# Patient Record
Sex: Female | Born: 1960 | Race: White | Hispanic: Yes | Marital: Single
Health system: Southern US, Community
[De-identification: ages and names within clinical notes are randomized; demographics above are authoritative.]

---

## 2000-12-01 ENCOUNTER — Encounter: Payer: Self-pay | Admitting: Emergency Medicine

## 2000-12-01 ENCOUNTER — Emergency Department (HOSPITAL_COMMUNITY): Admission: EM | Admit: 2000-12-01 | Discharge: 2000-12-01 | Payer: Self-pay | Admitting: Emergency Medicine

## 2004-01-28 ENCOUNTER — Ambulatory Visit (HOSPITAL_COMMUNITY): Admission: RE | Admit: 2004-01-28 | Discharge: 2004-01-28 | Payer: Self-pay | Admitting: *Deleted

## 2018-04-06 ENCOUNTER — Encounter (HOSPITAL_COMMUNITY): Payer: Self-pay | Admitting: Emergency Medicine

## 2018-04-06 ENCOUNTER — Emergency Department (HOSPITAL_COMMUNITY): Payer: Managed Care, Other (non HMO)

## 2018-04-06 ENCOUNTER — Emergency Department (HOSPITAL_COMMUNITY)
Admission: EM | Admit: 2018-04-06 | Discharge: 2018-04-06 | Disposition: A | Discharge status: Home / Self Care | Payer: Managed Care, Other (non HMO) | Attending: Emergency Medicine | Admitting: Emergency Medicine

## 2018-04-06 DIAGNOSIS — R202 Paresthesia of skin: Secondary | ICD-10-CM | POA: Insufficient documentation

## 2018-04-06 DIAGNOSIS — R51 Headache: Secondary | ICD-10-CM | POA: Insufficient documentation

## 2018-04-06 DIAGNOSIS — R519 Headache, unspecified: Secondary | ICD-10-CM

## 2018-04-06 LAB — I-STAT TROPONIN, ED: Troponin i, poc: 0 ng/mL (ref 0.00–0.08)

## 2018-04-06 LAB — BASIC METABOLIC PANEL
Anion gap: 9 (ref 5–15)
BUN: 9 mg/dL (ref 6–20)
CHLORIDE: 105 mmol/L (ref 98–111)
CO2: 26 mmol/L (ref 22–32)
CREATININE: 0.62 mg/dL (ref 0.44–1.00)
Calcium: 9 mg/dL (ref 8.9–10.3)
GFR calc Af Amer: >60 mL/min (ref >60)
GFR calc non Af Amer: >60 mL/min (ref >60)
GLUCOSE: 128 mg/dL — AB (ref 70–99)
Potassium: 3.6 mmol/L (ref 3.5–5.1)
SODIUM: 140 mmol/L (ref 135–145)

## 2018-04-06 LAB — CBC
HCT: 37.9 % (ref 36.0–46.0)
Hemoglobin: 12.2 g/dL (ref 12.0–15.0)
MCH: 28.4 pg (ref 26.0–34.0)
MCHC: 32.2 g/dL (ref 30.0–36.0)
MCV: 88.3 fL (ref 78.0–100.0)
Platelets: 232 10*3/uL (ref 150–400)
RBC: 4.29 MIL/uL (ref 3.87–5.11)
RDW: 13.5 % (ref 11.5–15.5)
WBC: 5.5 10*3/uL (ref 4.0–10.5)

## 2018-04-06 MED ORDER — SODIUM CHLORIDE 0.9 % IV BOLUS
1000.0000 mL | Freq: Once | INTRAVENOUS | Status: AC
  Administered 2018-04-06: 1000 mL via INTRAVENOUS

## 2018-04-06 MED ORDER — PROCHLORPERAZINE EDISYLATE 10 MG/2ML IJ SOLN
10.0000 mg | Freq: Once | INTRAMUSCULAR | Status: AC
Start: 2018-04-06 — End: 2018-04-06
  Administered 2018-04-06: 10 mg via INTRAVENOUS
  Filled 2018-04-06: qty 2

## 2018-04-06 MED ORDER — DIPHENHYDRAMINE HCL 50 MG/ML IJ SOLN
25.0000 mg | Freq: Once | INTRAMUSCULAR | Status: AC
Start: 2018-04-06 — End: 2018-04-06
  Administered 2018-04-06: 25 mg via INTRAVENOUS
  Filled 2018-04-06: qty 1

## 2018-04-06 MED ORDER — KETOROLAC TROMETHAMINE 15 MG/ML IJ SOLN
15.0000 mg | Freq: Once | INTRAMUSCULAR | Status: AC
  Administered 2018-04-06: 15 mg via INTRAVENOUS
  Filled 2018-04-06: qty 1

## 2018-04-06 NOTE — Discharge Instructions (Addendum)
You were evaluated in the Emergency Department and after careful evaluation, we did not find any emergent condition requiring admission or further testing in the hospital.  Your symptoms today seem to be due to a migraine.  Please follow-up with your primary care provider to discuss preventative medications.  Please return to the Emergency Department if you experience any worsening of your condition.  We encourage you to follow up with a primary care provider.  Thank you for allowing us to be a part of your care.

## 2018-04-06 NOTE — ED Provider Notes (Signed)
[facility] Provider Note MRN:  [id number]  Arrival date & time: 04/07/18     Chief Complaint   Headache History of Present Illness   Shirley Fosteratalia Koons is a 57 y.o. year-old female with a history of prediabetes presenting to the ED with chief complaint of headache.  Patient explains that she, in the past, has never experienced headaches.  For the past 1 to 2 weeks, has been experiencing frequent frontal headaches, with pain radiating to the right side of the neck.  Pain is moderate in severity, became worse last night and this morning.  Pain associated with bilateral facial paresthesias, described as a cold sensation, that also is present in the left arm.  Patient denies vision change, no chest pain, no shortness of breath, no abdominal pain, no dysuria.  No numbness or weakness in the arms or legs.  Review of Systems  A complete 10 system review of systems was obtained and all systems are negative except as noted in the HPI and PMH.  Patient's Health History   Past Medical/Surgical History:  Pre-diabetic  Family History:  Non-contributory  Social History:  Non-smoker   Social History   Socioeconomic History  . Marital status: Single    Spouse name: Not on file  . Number of children: Not on file  . Years of education: Not on file  . Highest education level: Not on file  Occupational History  . Not on file  Social Needs  . Financial resource strain: Not on file  . Food insecurity:    Worry: Not on file    Inability: Not on file  . Transportation needs:    Medical: Not on file    Non-medical: Not on file  Tobacco Use  . Smoking status: Not on file  Substance and Sexual Activity  . Alcohol use: Not on file  . Drug use: Not on file  . Sexual activity: Not on file  Lifestyle  . Physical activity:    Days per week: Not on file    Minutes per session: Not on file  . Stress: Not on file  Relationships  . Social connections:   Talks on phone: Not on file    Gets together: Not on file    Attends religious service: Not on file    Active member of club or organization: Not on file    Attends meetings of clubs or organizations: Not on file    Relationship status: Not on file  . Intimate partner violence:    Fear of current or ex partner: Not on file    Emotionally abused: Not on file    Physically abused: Not on file    Forced sexual activity: Not on file  Other Topics Concern  . Not on file  Social History Narrative  . Not on file     Physical Exam  Vital Signs and Nursing Notes reviewed Vitals:   04/06/18 1645 04/06/18 1700  BP: 96/61 (!) 96/59  Pulse: 61 (!) 57  Resp: 18 12  Temp:    SpO2: 100% 100%    CONSTITUTIONAL: Well-appearing, NAD NEURO:  Alert and oriented x 3, no focal deficits EYES:  eyes equal and reactive ENT/NECK:  no LAD, no JVD CARDIO: Regular rate, well-perfused, normal S1 and S2 PULM:  CTAB no wheezing or rhonchi GI/GU:  normal bowel sounds, non-distended, non-tender MSK/SPINE:  No gross deformities, no edema SKIN:  no rash, atraumatic PSYCH:  Appropriate speech and behavior  Diagnostic and  Interventional Summary    EKG Interpretation  Date/Time:  Sunday April 06 2018 12:55:04 EDT Ventricular Rate:  66 PR Interval:  128 QRS Duration: 84 QT Interval:  408 QTC Calculation: 427 R Axis:   89 Text Interpretation:  Normal sinus rhythm Normal ECG Confirmed by Kennis Carina [phone]) on 04/06/2018 2:44:03 PM      Labs Reviewed  BASIC METABOLIC PANEL - Abnormal; Notable for the following components:      Result Value   Glucose, Bld 128 (*)    All other components within normal limits  CBC  I-STAT TROPONIN, ED    CT Head Wo Contrast  Final Result    DG Chest 2 View  Final Result    DG Cervical Spine Complete  Final Result      Medications  prochlorperazine (COMPAZINE) injection 10 mg (10 mg Intravenous Given 04/06/18 1628)  ketorolac (TORADOL) 15 MG/ML injection  15 mg (15 mg Intravenous Given 04/06/18 1628)  diphenhydrAMINE (BENADRYL) injection 25 mg (25 mg Intravenous Given 04/06/18 1626)  sodium chloride 0.9 % bolus 1,000 mL (0 mLs Intravenous Stopped 04/06/18 1720)     Procedures Critical Care  ED Course and Medical Decision Making  I have reviewed the triage vital signs and the nursing notes.  Pertinent labs & imaging results that were available during my care of the patient were reviewed by me and considered in my medical decision making (see below for details). Clinical Course as of Apr 07 101  Sun Apr 06, 2018  [id number] 57 year old female otherwise healthy presenting with new headaches for the past 1 to 2 weeks.  Becoming more severe last night this morning.  Associated with bilateral facial paresthesias with paresthesias to the left arm.  No pain with range of motion of the neck, Spurling test negative.  Vital signs stable, well-appearing, no focal neurological deficits.  Favoring complex migraine, but given newness of these headaches, will CT head today.  Does not appear to be related to cervical radiculopathy.  Seems inconsistent with cardiac etiology.   [MB]  1709 Work-up unremarkable, CT negative.  Patient's headache and paresthesias are resolved after migraine cocktail.  Patient's neurological symptoms were inconsistent with TIA, favoring complex migraine.  Advised to follow-up with PCP to discuss preventative medications.After the discussed management above, the patient was determined to be safe for discharge.  The patient was in agreement with this plan and all questions regarding their care were answered.  ED return precautions were discussed and the patient will return to the ED with any significant worsening of condition.   [MB]    Clinical Course User Index [MB] Pilar Plate Elmer Sow, MD     Elmer Sow. Pilar Plate, MD [facility] [email or web address]  Final Clinical Impressions(s) / ED Diagnoses      ICD-10-CM   1. Nonintractable headache, unspecified chronicity pattern, unspecified headache type R51   2. Paresthesias R20.2     ED Discharge Orders    None         Sabas Sous, MD 04/07/18 [phone]

## 2018-04-06 NOTE — ED Notes (Signed)
Pt verbalizes understanding of d/c instructions. Pt ambulatory at d/c with all belongings. Pt's sister to come pick her up.

## 2018-04-06 NOTE — ED Triage Notes (Signed)
Patient complains of left arm pain radiating into neck and head for two months, also reports intermittent nausea with pain. States sometimes left arm is also numb. Patient denies shortness of breath. Patient alert, oriented, and in no apparent distress at this time.

## 2018-04-06 NOTE — ED Notes (Signed)
Patient transported to CT 

## 2019-08-18 IMAGING — CR DG CERVICAL SPINE COMPLETE 4+V
5 series · 5 of 5 positions shown · non-contrast
Comparison: None.

CLINICAL DATA: Left arm pain radiating to the neck for 2 months.

EXAM:
CERVICAL SPINE - COMPLETE 4+ VIEW

[c-spine lat]
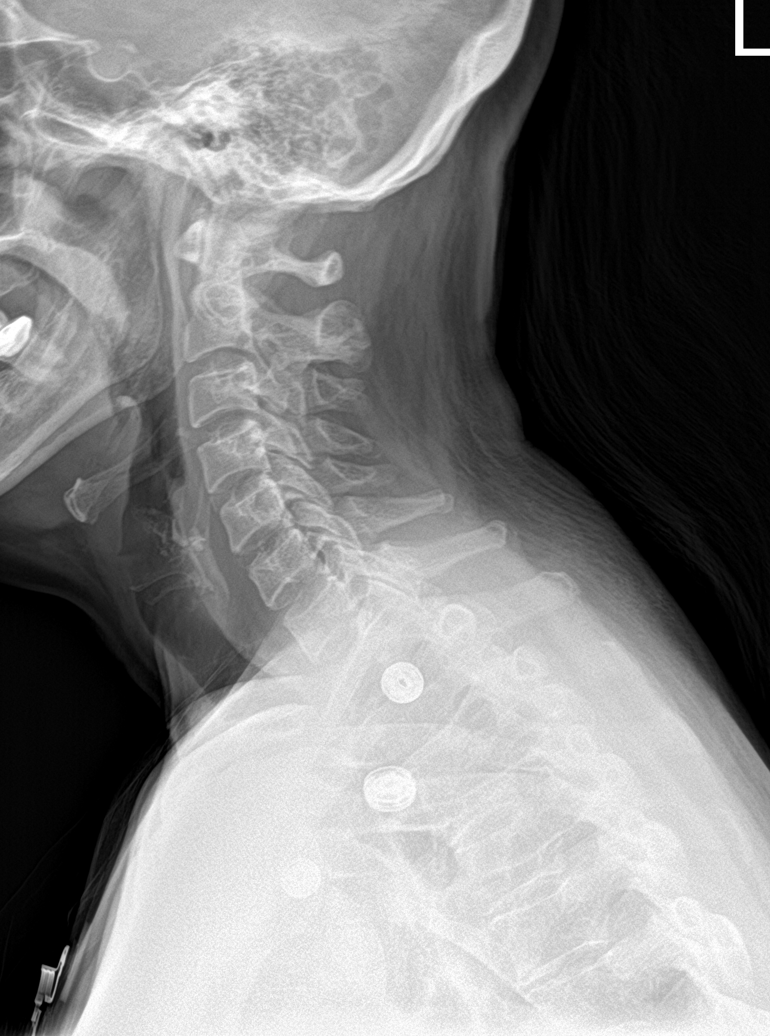

[c-spine obl (1 of 2)]
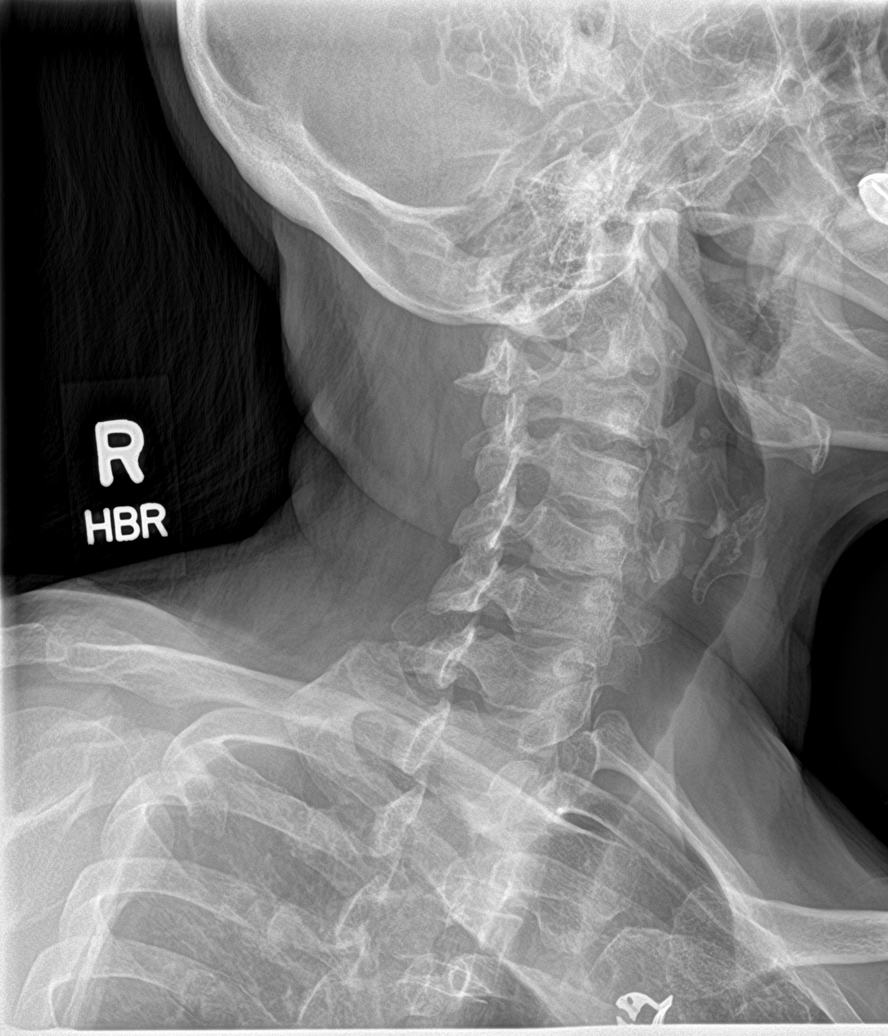

[c-spine obl (2 of 2)]
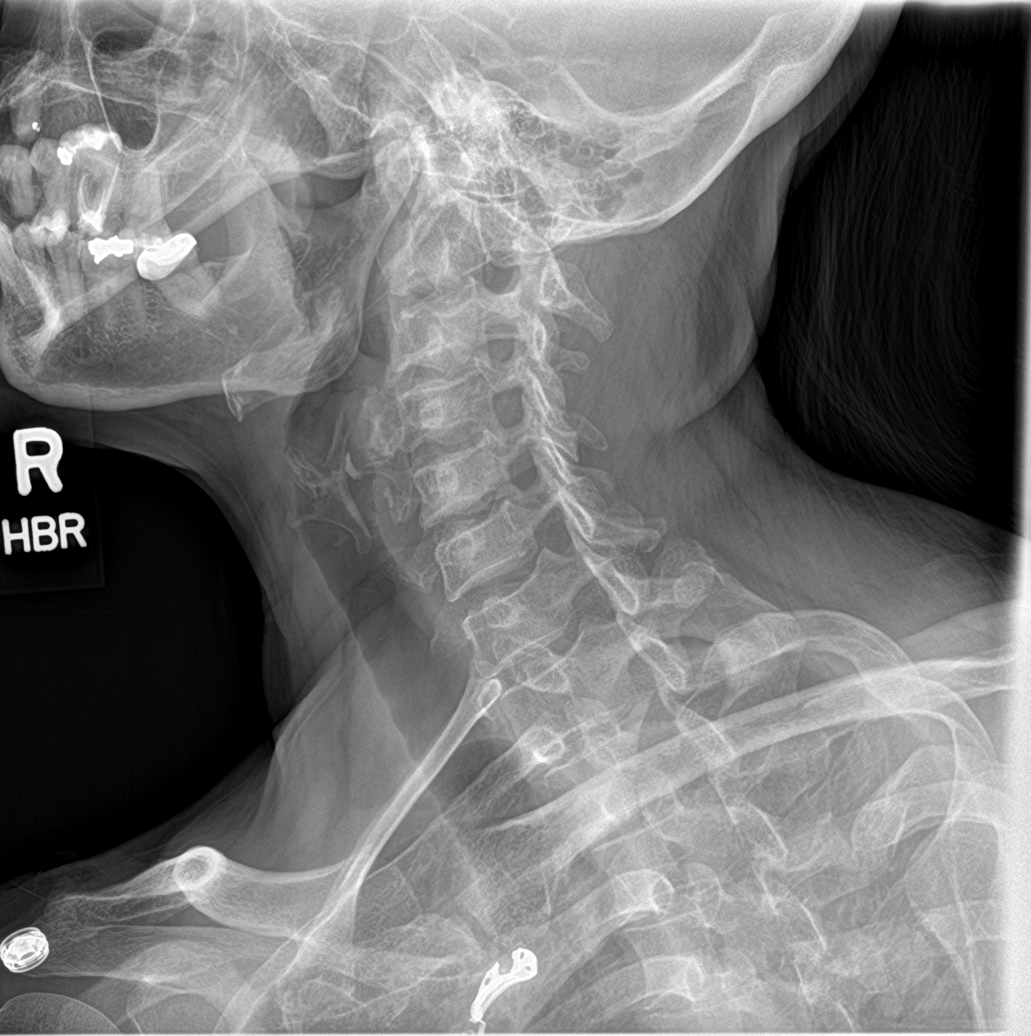

[c-spine ap]
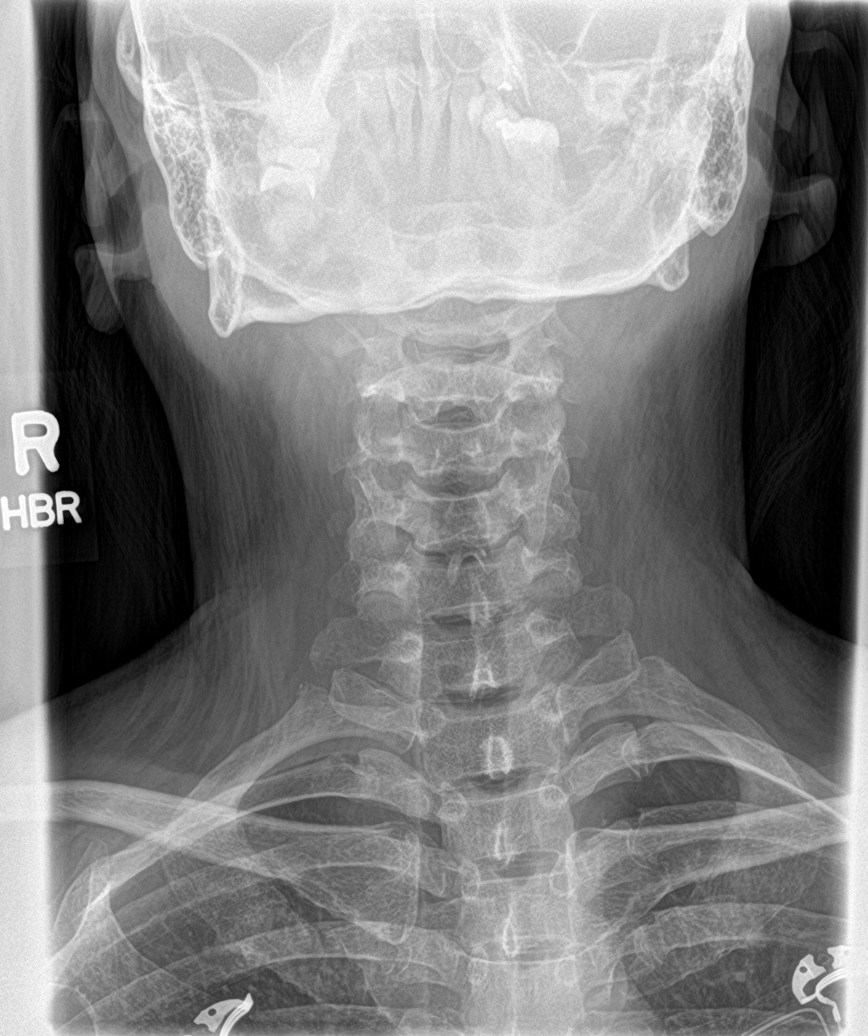

[c-spine open mouth]
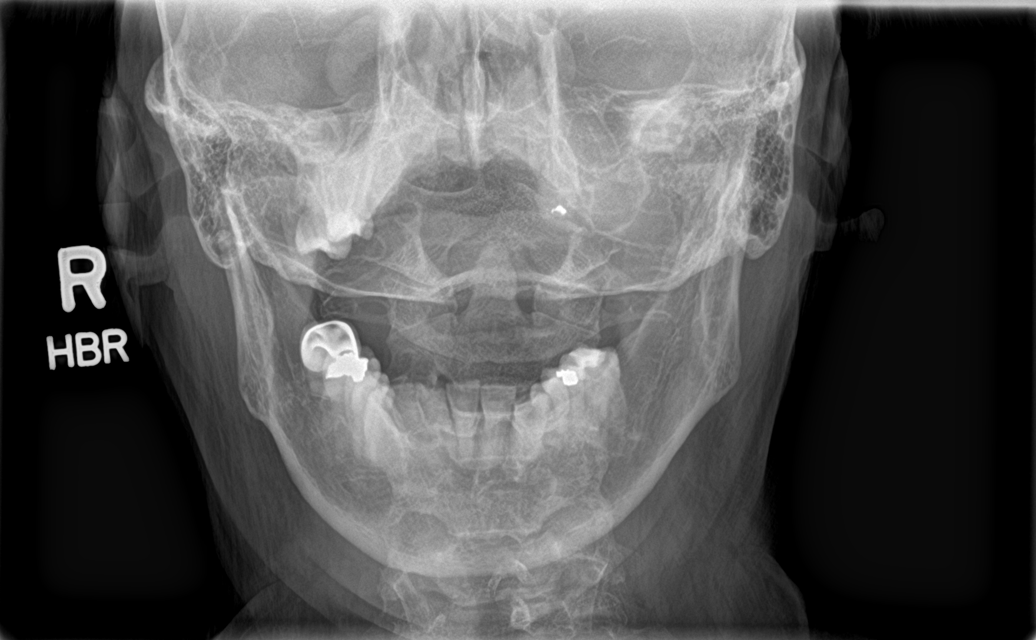

[5 of 5 positions shown; findings below may reference images not displayed]

FINDINGS: There is no evidence of cervical spine fracture or prevertebral soft
tissue swelling. Alignment is normal. The bilateral neural foramina
are patent. No other significant bone abnormalities are identified.
IMPRESSION: No acute abnormality. The bilateral neural foramina are patent
without significant degenerative joint changes noted.
